# Patient Record
Sex: Male | Born: 1965 | Race: White | Hispanic: No | Marital: Married | Smoking: Never smoker
Health system: Southern US, Community
[De-identification: ages and names within clinical notes are randomized; demographics above are authoritative.]

## PROBLEM LIST (undated history)

## (undated) DIAGNOSIS — I10 Essential (primary) hypertension: Secondary | ICD-10-CM

## (undated) HISTORY — PX: FRACTURE SURGERY: SHX138

---

## 2002-06-21 ENCOUNTER — Encounter: Payer: Self-pay | Admitting: Internal Medicine

## 2002-06-21 ENCOUNTER — Ambulatory Visit (HOSPITAL_COMMUNITY): Admission: RE | Admit: 2002-06-21 | Discharge: 2002-06-21 | Payer: Self-pay | Admitting: Internal Medicine

## 2002-07-15 ENCOUNTER — Encounter: Admission: RE | Admit: 2002-07-15 | Discharge: 2002-10-13 | Payer: Self-pay | Admitting: Internal Medicine

## 2006-01-23 ENCOUNTER — Ambulatory Visit: Payer: Self-pay | Admitting: Internal Medicine

## 2006-01-23 LAB — CONVERTED CEMR LAB
ALT: 30 units/L (ref 0–40)
AST: 26 units/L (ref 0–37)
Albumin: 4 g/dL (ref 3.5–5.2)
Alkaline Phosphatase: 51 units/L (ref 39–117)
BUN: 20 mg/dL (ref 6–23)
Basophils Absolute: 0 10*3/uL (ref 0.0–0.1)
Basophils Relative: 0.2 % (ref 0.0–1.0)
CO2: 30 meq/L (ref 19–32)
Calcium: 9.7 mg/dL (ref 8.4–10.5)
Chloride: 104 meq/L (ref 96–112)
Chol/HDL Ratio, serum: 4.8
Cholesterol: 236 mg/dL (ref 0–200)
Creatinine, Ser: 1.3 mg/dL (ref 0.5–1.7)
Eosinophil percent: 2.5 % (ref 0.0–5.0)
GFR calc non Af Amer: 62 mL/min
Glomerular Filtration Rate, Af Am: 75 mL/min/{1.73_m2}
Glucose, Bld: 85 mg/dL (ref 70–99)
HCT: 47.6 % (ref 39.0–52.0)
HDL: 49.6 mg/dL (ref >39.0)
Hemoglobin: 16.2 g/dL (ref 13.0–17.0)
LDL DIRECT: 182 mg/dL
Lymphocytes Relative: 35.3 % (ref 12.0–46.0)
MCHC: 34 g/dL (ref 30.0–36.0)
MCV: 86.9 fL (ref 78.0–100.0)
Monocytes Absolute: 0.6 10*3/uL (ref 0.2–0.7)
Monocytes Relative: 10.8 % (ref 3.0–11.0)
Neutro Abs: 2.9 10*3/uL (ref 1.4–7.7)
Neutrophils Relative %: 51.2 % (ref 43.0–77.0)
PSA: 0.31 ng/mL (ref 0.10–4.00)
Platelets: 220 10*3/uL (ref 150–400)
Potassium: 4.2 meq/L (ref 3.5–5.5)
RBC: 5.48 M/uL (ref 4.22–5.81)
RDW: 13.2 % (ref 11.5–14.6)
Sodium: 143 meq/L (ref 135–145)
TSH: 3.35 microintl units/mL (ref 0.35–5.50)
Total Bilirubin: 1 mg/dL (ref 0.3–1.2)
Total Protein: 6.9 g/dL (ref 6.0–8.3)
Triglyceride fasting, serum: 96 mg/dL (ref 0–149)
VLDL: 19 mg/dL (ref 0–40)
WBC: 5.6 10*3/uL (ref 4.5–10.5)

## 2006-02-10 ENCOUNTER — Ambulatory Visit: Payer: Self-pay | Admitting: Internal Medicine

## 2006-07-15 ENCOUNTER — Ambulatory Visit: Payer: Self-pay | Admitting: Internal Medicine

## 2006-07-15 LAB — CONVERTED CEMR LAB
Chol/HDL Ratio, serum: 4.6
Cholesterol: 192 mg/dL (ref 0–200)
HDL: 42.1 mg/dL (ref >39.0)
LDL Cholesterol: 133 mg/dL — ABNORMAL HIGH (ref 0–99)
Triglyceride fasting, serum: 83 mg/dL (ref 0–149)
VLDL: 17 mg/dL (ref 0–40)

## 2006-07-29 ENCOUNTER — Ambulatory Visit: Payer: Self-pay | Admitting: Internal Medicine

## 2006-09-05 ENCOUNTER — Ambulatory Visit: Payer: Self-pay | Admitting: Internal Medicine

## 2006-10-06 ENCOUNTER — Encounter: Admission: RE | Admit: 2006-10-06 | Discharge: 2007-01-04 | Payer: Self-pay | Admitting: Internal Medicine

## 2009-05-08 ENCOUNTER — Encounter: Payer: Self-pay | Admitting: Internal Medicine

## 2009-06-06 ENCOUNTER — Ambulatory Visit: Payer: Self-pay | Admitting: Internal Medicine

## 2009-06-06 LAB — CONVERTED CEMR LAB
ALT: 34 units/L (ref 0–53)
AST: 23 units/L (ref 0–37)
Albumin: 4.1 g/dL (ref 3.5–5.2)
Alkaline Phosphatase: 58 units/L (ref 39–117)
BUN: 16 mg/dL (ref 6–23)
Basophils Absolute: 0.1 10*3/uL (ref 0.0–0.1)
Basophils Relative: 1 % (ref 0.0–3.0)
Bilirubin Urine: NEGATIVE
Bilirubin, Direct: 0.1 mg/dL (ref 0.0–0.3)
CO2: 29 meq/L (ref 19–32)
Calcium: 9.2 mg/dL (ref 8.4–10.5)
Chloride: 101 meq/L (ref 96–112)
Cholesterol: 244 mg/dL — ABNORMAL HIGH (ref 0–200)
Creatinine, Ser: 1.1 mg/dL (ref 0.4–1.5)
Direct LDL: 175.3 mg/dL
Eosinophils Absolute: 0.1 10*3/uL (ref 0.0–0.7)
Eosinophils Relative: 2.5 % (ref 0.0–5.0)
GFR calc non Af Amer: 77.63 mL/min (ref >60)
Glucose, Bld: 98 mg/dL (ref 70–99)
HCT: 46.7 % (ref 39.0–52.0)
HDL: 50.2 mg/dL (ref >39.00)
Hemoglobin, Urine: NEGATIVE
Hemoglobin: 16.1 g/dL (ref 13.0–17.0)
Ketones, ur: NEGATIVE mg/dL
Leukocytes, UA: NEGATIVE
Lymphocytes Relative: 36.7 % (ref 12.0–46.0)
Lymphs Abs: 1.9 10*3/uL (ref 0.7–4.0)
MCHC: 34.5 g/dL (ref 30.0–36.0)
MCV: 89.2 fL (ref 78.0–100.0)
Monocytes Absolute: 0.6 10*3/uL (ref 0.1–1.0)
Monocytes Relative: 10.7 % (ref 3.0–12.0)
Neutro Abs: 2.5 10*3/uL (ref 1.4–7.7)
Neutrophils Relative %: 49.1 % (ref 43.0–77.0)
Nitrite: NEGATIVE
PSA: 0.42 ng/mL (ref 0.10–4.00)
Platelets: 181 10*3/uL (ref 150.0–400.0)
Potassium: 4.4 meq/L (ref 3.5–5.1)
RBC: 5.24 M/uL (ref 4.22–5.81)
RDW: 12.8 % (ref 11.5–14.6)
Sodium: 139 meq/L (ref 135–145)
Specific Gravity, Urine: 1.015 (ref 1.000–1.030)
TSH: 2.69 microintl units/mL (ref 0.35–5.50)
Total Bilirubin: 1 mg/dL (ref 0.3–1.2)
Total CHOL/HDL Ratio: 5
Total Protein, Urine: NEGATIVE mg/dL
Total Protein: 6.9 g/dL (ref 6.0–8.3)
Triglycerides: 173 mg/dL — ABNORMAL HIGH (ref 0.0–149.0)
Urine Glucose: NEGATIVE mg/dL
Urobilinogen, UA: 0.2 (ref 0.0–1.0)
VLDL: 34.6 mg/dL (ref 0.0–40.0)
WBC: 5.2 10*3/uL (ref 4.5–10.5)
pH: 6.5 (ref 5.0–8.0)

## 2009-06-12 ENCOUNTER — Ambulatory Visit: Payer: Self-pay | Admitting: Internal Medicine

## 2009-06-12 DIAGNOSIS — E785 Hyperlipidemia, unspecified: Secondary | ICD-10-CM | POA: Insufficient documentation

## 2009-06-12 DIAGNOSIS — E559 Vitamin D deficiency, unspecified: Secondary | ICD-10-CM | POA: Insufficient documentation

## 2009-06-12 DIAGNOSIS — I10 Essential (primary) hypertension: Secondary | ICD-10-CM | POA: Insufficient documentation

## 2009-09-19 ENCOUNTER — Ambulatory Visit: Payer: Self-pay | Admitting: Internal Medicine

## 2009-09-19 LAB — CONVERTED CEMR LAB
ALT: 28 units/L (ref 0–53)
AST: 21 units/L (ref 0–37)
Albumin: 4.1 g/dL (ref 3.5–5.2)
Alkaline Phosphatase: 50 units/L (ref 39–117)
Bilirubin, Direct: 0.2 mg/dL (ref 0.0–0.3)
Cholesterol: 201 mg/dL — ABNORMAL HIGH (ref 0–200)
Direct LDL: 138.2 mg/dL
HDL: 48 mg/dL (ref >39.00)
Total Bilirubin: 0.8 mg/dL (ref 0.3–1.2)
Total CHOL/HDL Ratio: 4
Total Protein: 7 g/dL (ref 6.0–8.3)
Triglycerides: 173 mg/dL — ABNORMAL HIGH (ref 0.0–149.0)
VLDL: 34.6 mg/dL (ref 0.0–40.0)

## 2009-09-20 LAB — CONVERTED CEMR LAB: Vit D, 25-Hydroxy: 24 ng/mL — ABNORMAL LOW (ref 30–89)

## 2009-09-26 ENCOUNTER — Ambulatory Visit: Payer: Self-pay | Admitting: Internal Medicine

## 2009-09-26 DIAGNOSIS — Z9189 Other specified personal risk factors, not elsewhere classified: Secondary | ICD-10-CM

## 2010-02-23 ENCOUNTER — Ambulatory Visit: Payer: Self-pay | Admitting: Internal Medicine

## 2010-02-23 LAB — CONVERTED CEMR LAB: Vit D, 25-Hydroxy: 53 ng/mL (ref 30–89)

## 2010-03-13 ENCOUNTER — Telehealth: Payer: Self-pay | Admitting: Internal Medicine

## 2010-09-11 NOTE — Progress Notes (Signed)
Summary: lab results  Phone Note Call from Patient   Caller: Patient Call For: Birdie Sons MD Summary of Call: [phone] lab results. Initial call taken by: Lynann Beaver CMA,  March 13, 2010 8:55 AM  Follow-up for Phone Call        Patient notified of normal Vit D level.  Told to take OTC Vit D 2000u once daily . Follow-up by: Gladis Riffle, RN,  March 13, 2010 9:52 AM    New/Updated Medications: VITAMIN D 2000 UNIT TABS (CHOLECALCIFEROL) once daily

## 2010-09-11 NOTE — Assessment & Plan Note (Signed)
Summary: 3 MONTH ROV/NJR   Vital Signs:  Patient profile:   45 year old male Weight:      242 pounds BMI:     32.94 Temp:     98.2 degrees F Pulse rate:   72 / minute Resp:     12 per minute BP sitting:   130 / 96  (left arm)  Vitals Entered By: Gladis Riffle, RN (September 26, 2009 10:34 AM) CC: 3 month rov, labs done Is Patient Diabetic? No Comments BP 108/69-134/88 at home average 128/85   CC:  3 month rov and labs done.  History of Present Illness:  Follow-Up Visit      This is a 45 year old man who presents for Follow-up visit.  The patient denies chest pain and palpitations.  Since the last visit the patient notes no new problems or concerns.  The patient reports taking meds as prescribed.  When questioned about possible medication side effects, the patient notes none.    All other systems reviewed and were negative   Preventive Screening-Counseling & Management  Alcohol-Tobacco     Smoking Status: never  Medications Prior to Update: 1)  Benadryl 25 Mg Caps (Diphenhydramine Hcl) .... Prn 2)  Ergocalciferol 50000 Unit Caps (Ergocalciferol) .... One By Mouth Weekly  Allergies (verified): No Known Drug Allergies  Past History:  Past Medical History: Last updated: 06/12/2009 Unremarkable Hyperlipidemia  Past Surgical History: Last updated: 04/21/2007 Denies surgical history  Family History: Last updated: 06/12/2009 Family History of Dementia---mother (late 13s), grandmother Family History Diabetes 1st degree relative Family History High cholesterol---father Family History Hypertension---father  Social History: Last updated: 06/12/2009 Occupation: Systems developer Married Never Smoked Alcohol use-yes Drug use-no Regular exercise-no  Risk Factors: Exercise: no (04/21/2007)  Risk Factors: Smoking Status: never (09/26/2009)  Review of Systems       All other systems reviewed and were negative   Physical Exam  General:   Well-developed,well-nourished,in no acute distress; alert,appropriate and cooperative throughout examination Head:  Normocephalic and atraumatic without obvious abnormalities. No apparent alopecia or balding. Neck:  No deformities, masses, or tenderness noted. Chest Wall:  No deformities, masses, tenderness or gynecomastia noted. Lungs:  Normal respiratory effort, chest expands symmetrically. Lungs are clear to auscultation, no crackles or wheezes. Abdomen:  Bowel sounds positive,abdomen soft and non-tender without masses, organomegaly or hernias noted. Msk:  No deformity or scoliosis noted of thoracic or lumbar spine.   Pulses:  R radial normal and L radial normal.   Neurologic:  cranial nerves II-XII intact and gait normal.     Impression & Recommendations:  Problem # 1:  HYPERLIPIDEMIA (ICD-272.4) much improved he says he has lost some weight Labs Reviewed: SGOT: 21 (09/19/2009)   SGPT: 28 (09/19/2009)   HDL:48.00 (09/19/2009), 50.20 (06/06/2009)  LDL:133 (07/15/2006), DEL (01/23/2006)  Chol:201 (09/19/2009), 244 (06/06/2009)  Trig:173.0 (09/19/2009), 173.0 (06/06/2009)  Problem # 2:  UNSPECIFIED ESSENTIAL HYPERTENSION (ICD-401.9) no sxs---BP better  Complete Medication List: 1)  Benadryl 25 Mg Caps (Diphenhydramine hcl) .... Prn 2)  Ergocalciferol 50000 Unit Caps (Ergocalciferol) .... One by mouth weekly  Patient Instructions: 1)  4months 2)  vit d level-733.00 Prescriptions: ERGOCALCIFEROL 50000 UNIT CAPS (ERGOCALCIFEROL) one by mouth weekly  #1 bottle x 3   Entered and Authorized by:   Birdie Sons MD   Signed by:   Birdie Sons MD on 09/26/2009   Method used:   Reprint   RxID:   [id number] ERGOCALCIFEROL 50000 UNIT CAPS (ERGOCALCIFEROL) one by mouth weekly  #  1 bottle x 3   Entered and Authorized by:   Birdie Sons MD   Signed by:   Birdie Sons MD on 09/26/2009   Method used:   Electronically to        [id number] (retail)       [address]        [place], [place]  [address]       Ph: [phone] or [phone]       Fax: [phone]   RxID:   [id number]   Prevention & Chronic Care Immunizations   Influenza vaccine: Not documented    Tetanus booster: 08/12/2005: Historical    Pneumococcal vaccine: Not documented  Other Screening   Smoking status: never  (09/26/2009)  Lipids   Total Cholesterol: 201  (09/19/2009)   LDL: 133  (07/15/2006)   LDL Direct: 138.2  (09/19/2009)   HDL: 48.00  (09/19/2009)   Triglycerides: 173.0  (09/19/2009)    SGOT (AST): 21  (09/19/2009)   SGPT (ALT): 28  (09/19/2009)   Alkaline phosphatase: 50  (09/19/2009)   Total bilirubin: 0.8  (09/19/2009)  Hypertension   Last Blood Pressure: 130 / 96  (09/26/2009)   Serum creatinine: 1.1  (06/06/2009)   Serum potassium 4.4  (06/06/2009)  Self-Management Support :    Hypertension self-management support: Not documented    Lipid self-management support: Not documented

## 2013-03-26 ENCOUNTER — Ambulatory Visit: Payer: BC Managed Care – PPO | Admitting: Physician Assistant

## 2013-03-26 DIAGNOSIS — T6391XA Toxic effect of contact with unspecified venomous animal, accidental (unintentional), initial encounter: Secondary | ICD-10-CM

## 2013-03-26 LAB — POCT CBC
Granulocyte percent: 69.6 %G (ref 37–80)
HCT, POC: 48.3 % (ref 43.5–53.7)
MCV: 92.1 fL (ref 80–97)
MID (cbc): 0.6 (ref 0–0.9)
Platelet Count, POC: 210 10*3/uL (ref 142–424)
RBC: 5.24 M/uL (ref 4.69–6.13)

## 2013-03-26 MED ORDER — PREDNISONE 20 MG PO TABS: ORAL_TABLET | ORAL | Status: DC

## 2013-03-26 MED ORDER — SULFAMETHOXAZOLE-TMP DS 800-160 MG PO TABS: 1.0000 | ORAL_TABLET | Freq: Two times a day (BID) | ORAL | Status: DC

## 2013-03-26 NOTE — Progress Notes (Signed)
Patient ID: Jakyrie Gillooly MRN: [id number], DOB: 24-Feb-1966, 47 y.o. Date of Encounter: 03/26/2013, 2:21 PM  Primary Physician: Judie Petit, MD  Chief Complaint: Bee sting  HPI: 47 y.o. male presents with a yellow jacket/wasp sting along the right arm. Sting occurred 2 days ago. Notes increasing mild erythema along the right arm that is extending to the mid forearm and up to the axilla. He was also stung/bitten along the right lateral chest wall, however this area is resolving ok. Afebrile. No pain over the elbow. No SOB, wheezing, dyspnea, difficulty swallowing, or angioedema symptoms. He was recently stung by a yellow jacket/wasp a couple of months ago on his leg. This area became an urticarial lesion for a couple of days and self resolved. Has taken Benadryl.   No past medical history on file.   Home Meds: Prior to Admission medications   Not on File    Allergies: No Known Allergies  History   Social History  . Marital Status: Married    Spouse Name: N/A    Number of Children: N/A  . Years of Education: N/A   Occupational History  . Not on file.   Social History Main Topics  . Smoking status: Never Smoker   . Smokeless tobacco: Not on file  . Alcohol Use: Not on file  . Drug Use: Not on file  . Sexual Activity: Not on file   Other Topics Concern  . Not on file   Social History Narrative  . No narrative on file     Review of Systems: Constitutional: negative for chills, fever, or fatigue  Respiratory: negative for wheezing, shortness of breath, or cough Dermatological: see above   Physical Exam: Blood pressure 118/72, pulse 102, temperature 98 F (36.7 C), temperature source Oral, resp. rate 17, height 6\' 1"  (1.854 m), weight 233 lb (105.688 kg), SpO2 97.00%., Body mass index is 30.75 kg/(m^2). General: Well developed, well nourished, in no acute distress. Head: Normocephalic, atraumatic, eyes without discharge, sclera non-icteric, nares are  without discharge.   Neck: Supple. Full ROM.  Lungs: Breathing is unlabored. Heart: Regular rate. Msk:  Strength and tone normal for age. Extremities/Skin: Warm and dry. No clubbing or cyanosis. No edema. Right arm with mild erythema the medial aspect of the elbow that extends distally to the mid forearm and proximally to just below the axilla. Erythema itself resembles allergic reaction, not cellulitis at this time. Mild STS along the forearm and olecranon. Non TTP. FROM. Cap refill less than 2 seconds of the distal extremities.  Neuro: Alert and oriented X 3. Moves all extremities spontaneously. Gait is normal. CNII-XII grossly in tact. Psych:  Responds to questions appropriately with a normal affect.   Labs: Results for orders placed in visit on 03/26/13  POCT CBC      Result Value Range   WBC 7.3  4.6 - 10.2 K/uL   Lymph, poc 1.6  0.6 - 3.4   POC LYMPH PERCENT 21.9  10 - 50 %L   MID (cbc) 0.6  0 - 0.9   POC MID % 8.5  0 - 12 %M   POC Granulocyte 5.1  2 - 6.9   Granulocyte percent 69.6  37 - 80 %G   RBC 5.24  4.69 - 6.13 M/uL   Hemoglobin 16.2  14.1 - 18.1 g/dL   HCT, POC 14.7  82.9 - 53.7 %   MCV 92.1  80 - 97 fL   MCH, POC 30.9  27 - 31.2 pg  MCHC 33.5  31.8 - 35.4 g/dL   RDW, POC 78.4     Platelet Count, POC 210  142 - 424 K/uL   MPV 10.4  0 - 99.8 fL     ASSESSMENT AND PLAN:  47 y.o. male with bee sting/allergic reaction. -Bactrim DS 1 po bid #20 no RF, cover for possible early cellulitis. No current cellulitis at this time.  -Prednisone 20 mg #12 3x2, 2x2, 1x2 no RF -At this time this does not have the appearance of a cellulitic lesion; however given its location I feel it is appropriate to go ahead and treat him with a course of antibiotics to prevent secondary infection.  -Declines Epi Pen -Given at home precautions for future bites/stings -RTC prn  Signed, Eula Listen, PA-C 03/26/2013 2:21 PM

## 2016-04-03 DIAGNOSIS — I10 Essential (primary) hypertension: Secondary | ICD-10-CM | POA: Diagnosis not present

## 2016-04-03 DIAGNOSIS — R600 Localized edema: Secondary | ICD-10-CM | POA: Diagnosis not present

## 2016-04-03 DIAGNOSIS — E785 Hyperlipidemia, unspecified: Secondary | ICD-10-CM | POA: Diagnosis not present

## 2016-04-09 DIAGNOSIS — H40053 Ocular hypertension, bilateral: Secondary | ICD-10-CM | POA: Diagnosis not present

## 2016-07-23 DIAGNOSIS — I10 Essential (primary) hypertension: Secondary | ICD-10-CM | POA: Diagnosis not present

## 2016-09-12 DIAGNOSIS — I10 Essential (primary) hypertension: Secondary | ICD-10-CM | POA: Diagnosis not present

## 2016-09-12 DIAGNOSIS — Z0000 Encounter for general adult medical examination without abnormal findings: Secondary | ICD-10-CM | POA: Diagnosis not present

## 2016-09-12 DIAGNOSIS — E785 Hyperlipidemia, unspecified: Secondary | ICD-10-CM | POA: Diagnosis not present

## 2016-09-12 DIAGNOSIS — E559 Vitamin D deficiency, unspecified: Secondary | ICD-10-CM | POA: Diagnosis not present

## 2016-09-12 DIAGNOSIS — Z125 Encounter for screening for malignant neoplasm of prostate: Secondary | ICD-10-CM | POA: Diagnosis not present

## 2016-09-12 DIAGNOSIS — Z23 Encounter for immunization: Secondary | ICD-10-CM | POA: Diagnosis not present

## 2017-06-12 DIAGNOSIS — M25562 Pain in left knee: Secondary | ICD-10-CM | POA: Diagnosis not present

## 2017-06-12 DIAGNOSIS — Z23 Encounter for immunization: Secondary | ICD-10-CM | POA: Diagnosis not present

## 2017-06-23 DIAGNOSIS — M25561 Pain in right knee: Secondary | ICD-10-CM | POA: Diagnosis not present

## 2017-09-16 DIAGNOSIS — Z125 Encounter for screening for malignant neoplasm of prostate: Secondary | ICD-10-CM | POA: Diagnosis not present

## 2017-09-16 DIAGNOSIS — E785 Hyperlipidemia, unspecified: Secondary | ICD-10-CM | POA: Diagnosis not present

## 2017-09-16 DIAGNOSIS — E559 Vitamin D deficiency, unspecified: Secondary | ICD-10-CM | POA: Diagnosis not present

## 2017-09-16 DIAGNOSIS — R7309 Other abnormal glucose: Secondary | ICD-10-CM | POA: Diagnosis not present

## 2017-09-19 DIAGNOSIS — Z0000 Encounter for general adult medical examination without abnormal findings: Secondary | ICD-10-CM | POA: Diagnosis not present

## 2018-04-09 DIAGNOSIS — H40053 Ocular hypertension, bilateral: Secondary | ICD-10-CM | POA: Diagnosis not present

## 2018-05-13 DIAGNOSIS — M2550 Pain in unspecified joint: Secondary | ICD-10-CM | POA: Diagnosis not present

## 2018-05-13 DIAGNOSIS — I10 Essential (primary) hypertension: Secondary | ICD-10-CM | POA: Diagnosis not present

## 2018-05-13 DIAGNOSIS — Z23 Encounter for immunization: Secondary | ICD-10-CM | POA: Diagnosis not present

## 2018-06-04 DIAGNOSIS — R7309 Other abnormal glucose: Secondary | ICD-10-CM | POA: Diagnosis not present

## 2018-06-04 DIAGNOSIS — I10 Essential (primary) hypertension: Secondary | ICD-10-CM | POA: Diagnosis not present

## 2018-09-24 DIAGNOSIS — Z0000 Encounter for general adult medical examination without abnormal findings: Secondary | ICD-10-CM | POA: Diagnosis not present

## 2018-09-24 DIAGNOSIS — M109 Gout, unspecified: Secondary | ICD-10-CM | POA: Diagnosis not present

## 2018-09-24 DIAGNOSIS — R7303 Prediabetes: Secondary | ICD-10-CM | POA: Diagnosis not present

## 2018-09-24 DIAGNOSIS — Z125 Encounter for screening for malignant neoplasm of prostate: Secondary | ICD-10-CM | POA: Diagnosis not present

## 2018-09-24 DIAGNOSIS — E785 Hyperlipidemia, unspecified: Secondary | ICD-10-CM | POA: Diagnosis not present

## 2018-09-24 DIAGNOSIS — Z23 Encounter for immunization: Secondary | ICD-10-CM | POA: Diagnosis not present

## 2018-09-24 DIAGNOSIS — E559 Vitamin D deficiency, unspecified: Secondary | ICD-10-CM | POA: Diagnosis not present

## 2019-01-29 DIAGNOSIS — Z23 Encounter for immunization: Secondary | ICD-10-CM | POA: Diagnosis not present

## 2019-02-03 DIAGNOSIS — M7710 Lateral epicondylitis, unspecified elbow: Secondary | ICD-10-CM | POA: Diagnosis not present

## 2020-02-03 DIAGNOSIS — E785 Hyperlipidemia, unspecified: Secondary | ICD-10-CM | POA: Diagnosis not present

## 2020-02-03 DIAGNOSIS — Z125 Encounter for screening for malignant neoplasm of prostate: Secondary | ICD-10-CM | POA: Diagnosis not present

## 2020-02-03 DIAGNOSIS — Z0000 Encounter for general adult medical examination without abnormal findings: Secondary | ICD-10-CM | POA: Diagnosis not present

## 2020-02-03 DIAGNOSIS — M109 Gout, unspecified: Secondary | ICD-10-CM | POA: Diagnosis not present

## 2020-02-03 DIAGNOSIS — R7303 Prediabetes: Secondary | ICD-10-CM | POA: Diagnosis not present

## 2020-02-03 DIAGNOSIS — E559 Vitamin D deficiency, unspecified: Secondary | ICD-10-CM | POA: Diagnosis not present

## 2020-04-26 DIAGNOSIS — Z20822 Contact with and (suspected) exposure to covid-19: Secondary | ICD-10-CM | POA: Diagnosis not present

## 2020-04-26 DIAGNOSIS — Z03818 Encounter for observation for suspected exposure to other biological agents ruled out: Secondary | ICD-10-CM | POA: Diagnosis not present

## 2020-08-01 DIAGNOSIS — Z20822 Contact with and (suspected) exposure to covid-19: Secondary | ICD-10-CM | POA: Diagnosis not present

## 2020-08-15 DIAGNOSIS — U071 COVID-19: Secondary | ICD-10-CM | POA: Diagnosis not present

## 2020-08-15 DIAGNOSIS — Z20822 Contact with and (suspected) exposure to covid-19: Secondary | ICD-10-CM | POA: Diagnosis not present

## 2020-08-21 DIAGNOSIS — Z03818 Encounter for observation for suspected exposure to other biological agents ruled out: Secondary | ICD-10-CM | POA: Diagnosis not present

## 2020-08-21 DIAGNOSIS — Z20822 Contact with and (suspected) exposure to covid-19: Secondary | ICD-10-CM | POA: Diagnosis not present

## 2020-11-13 DIAGNOSIS — R059 Cough, unspecified: Secondary | ICD-10-CM | POA: Diagnosis not present

## 2020-11-13 DIAGNOSIS — J988 Other specified respiratory disorders: Secondary | ICD-10-CM | POA: Diagnosis not present

## 2020-11-14 DIAGNOSIS — H25043 Posterior subcapsular polar age-related cataract, bilateral: Secondary | ICD-10-CM | POA: Diagnosis not present

## 2020-11-14 DIAGNOSIS — H524 Presbyopia: Secondary | ICD-10-CM | POA: Diagnosis not present

## 2020-11-14 DIAGNOSIS — H52203 Unspecified astigmatism, bilateral: Secondary | ICD-10-CM | POA: Diagnosis not present

## 2020-11-14 DIAGNOSIS — H5203 Hypermetropia, bilateral: Secondary | ICD-10-CM | POA: Diagnosis not present

## 2021-01-04 ENCOUNTER — Other Ambulatory Visit (HOSPITAL_COMMUNITY): Payer: Self-pay | Admitting: Family Medicine

## 2021-01-26 ENCOUNTER — Other Ambulatory Visit: Payer: Self-pay | Admitting: Family Medicine

## 2021-01-26 ENCOUNTER — Ambulatory Visit
Admission: RE | Admit: 2021-01-26 | Discharge: 2021-01-26 | Disposition: A | Discharge status: Home / Self Care | Payer: BC Managed Care – PPO | Source: Ambulatory Visit | Attending: Family Medicine | Admitting: Family Medicine

## 2021-01-26 DIAGNOSIS — Z136 Encounter for screening for cardiovascular disorders: Secondary | ICD-10-CM | POA: Diagnosis not present

## 2021-01-31 ENCOUNTER — Other Ambulatory Visit: Payer: Self-pay

## 2021-01-31 ENCOUNTER — Ambulatory Visit (HOSPITAL_BASED_OUTPATIENT_CLINIC_OR_DEPARTMENT_OTHER)
Admission: RE | Admit: 2021-01-31 | Discharge: 2021-01-31 | Disposition: A | Discharge status: Home / Self Care | Payer: Self-pay | Source: Ambulatory Visit | Attending: Family Medicine | Admitting: Family Medicine

## 2021-01-31 DIAGNOSIS — Z136 Encounter for screening for cardiovascular disorders: Secondary | ICD-10-CM | POA: Insufficient documentation

## 2021-02-26 DIAGNOSIS — Z136 Encounter for screening for cardiovascular disorders: Secondary | ICD-10-CM | POA: Diagnosis not present

## 2021-02-26 DIAGNOSIS — E559 Vitamin D deficiency, unspecified: Secondary | ICD-10-CM | POA: Diagnosis not present

## 2021-02-26 DIAGNOSIS — Z125 Encounter for screening for malignant neoplasm of prostate: Secondary | ICD-10-CM | POA: Diagnosis not present

## 2021-02-26 DIAGNOSIS — Z0000 Encounter for general adult medical examination without abnormal findings: Secondary | ICD-10-CM | POA: Diagnosis not present

## 2021-02-26 DIAGNOSIS — M109 Gout, unspecified: Secondary | ICD-10-CM | POA: Diagnosis not present

## 2021-02-26 DIAGNOSIS — R7309 Other abnormal glucose: Secondary | ICD-10-CM | POA: Diagnosis not present

## 2021-02-26 DIAGNOSIS — Z1159 Encounter for screening for other viral diseases: Secondary | ICD-10-CM | POA: Diagnosis not present

## 2021-02-26 DIAGNOSIS — E785 Hyperlipidemia, unspecified: Secondary | ICD-10-CM | POA: Diagnosis not present

## 2021-02-28 DIAGNOSIS — Z0000 Encounter for general adult medical examination without abnormal findings: Secondary | ICD-10-CM | POA: Diagnosis not present

## 2021-02-28 DIAGNOSIS — E559 Vitamin D deficiency, unspecified: Secondary | ICD-10-CM | POA: Diagnosis not present

## 2021-02-28 DIAGNOSIS — I10 Essential (primary) hypertension: Secondary | ICD-10-CM | POA: Diagnosis not present

## 2021-02-28 DIAGNOSIS — M109 Gout, unspecified: Secondary | ICD-10-CM | POA: Diagnosis not present

## 2021-02-28 DIAGNOSIS — E785 Hyperlipidemia, unspecified: Secondary | ICD-10-CM | POA: Diagnosis not present

## 2021-08-22 DIAGNOSIS — L089 Local infection of the skin and subcutaneous tissue, unspecified: Secondary | ICD-10-CM | POA: Diagnosis not present

## 2021-08-22 DIAGNOSIS — L02211 Cutaneous abscess of abdominal wall: Secondary | ICD-10-CM | POA: Diagnosis not present

## 2021-09-13 IMAGING — DX DG CHEST 2V
2 series · 2 of 2 positions shown · non-contrast
Comparison: None.

CLINICAL DATA: Screening for cardiovascular condition

EXAM:
CHEST - 2 VIEW

[dg chest 2 view (1 of 2)]
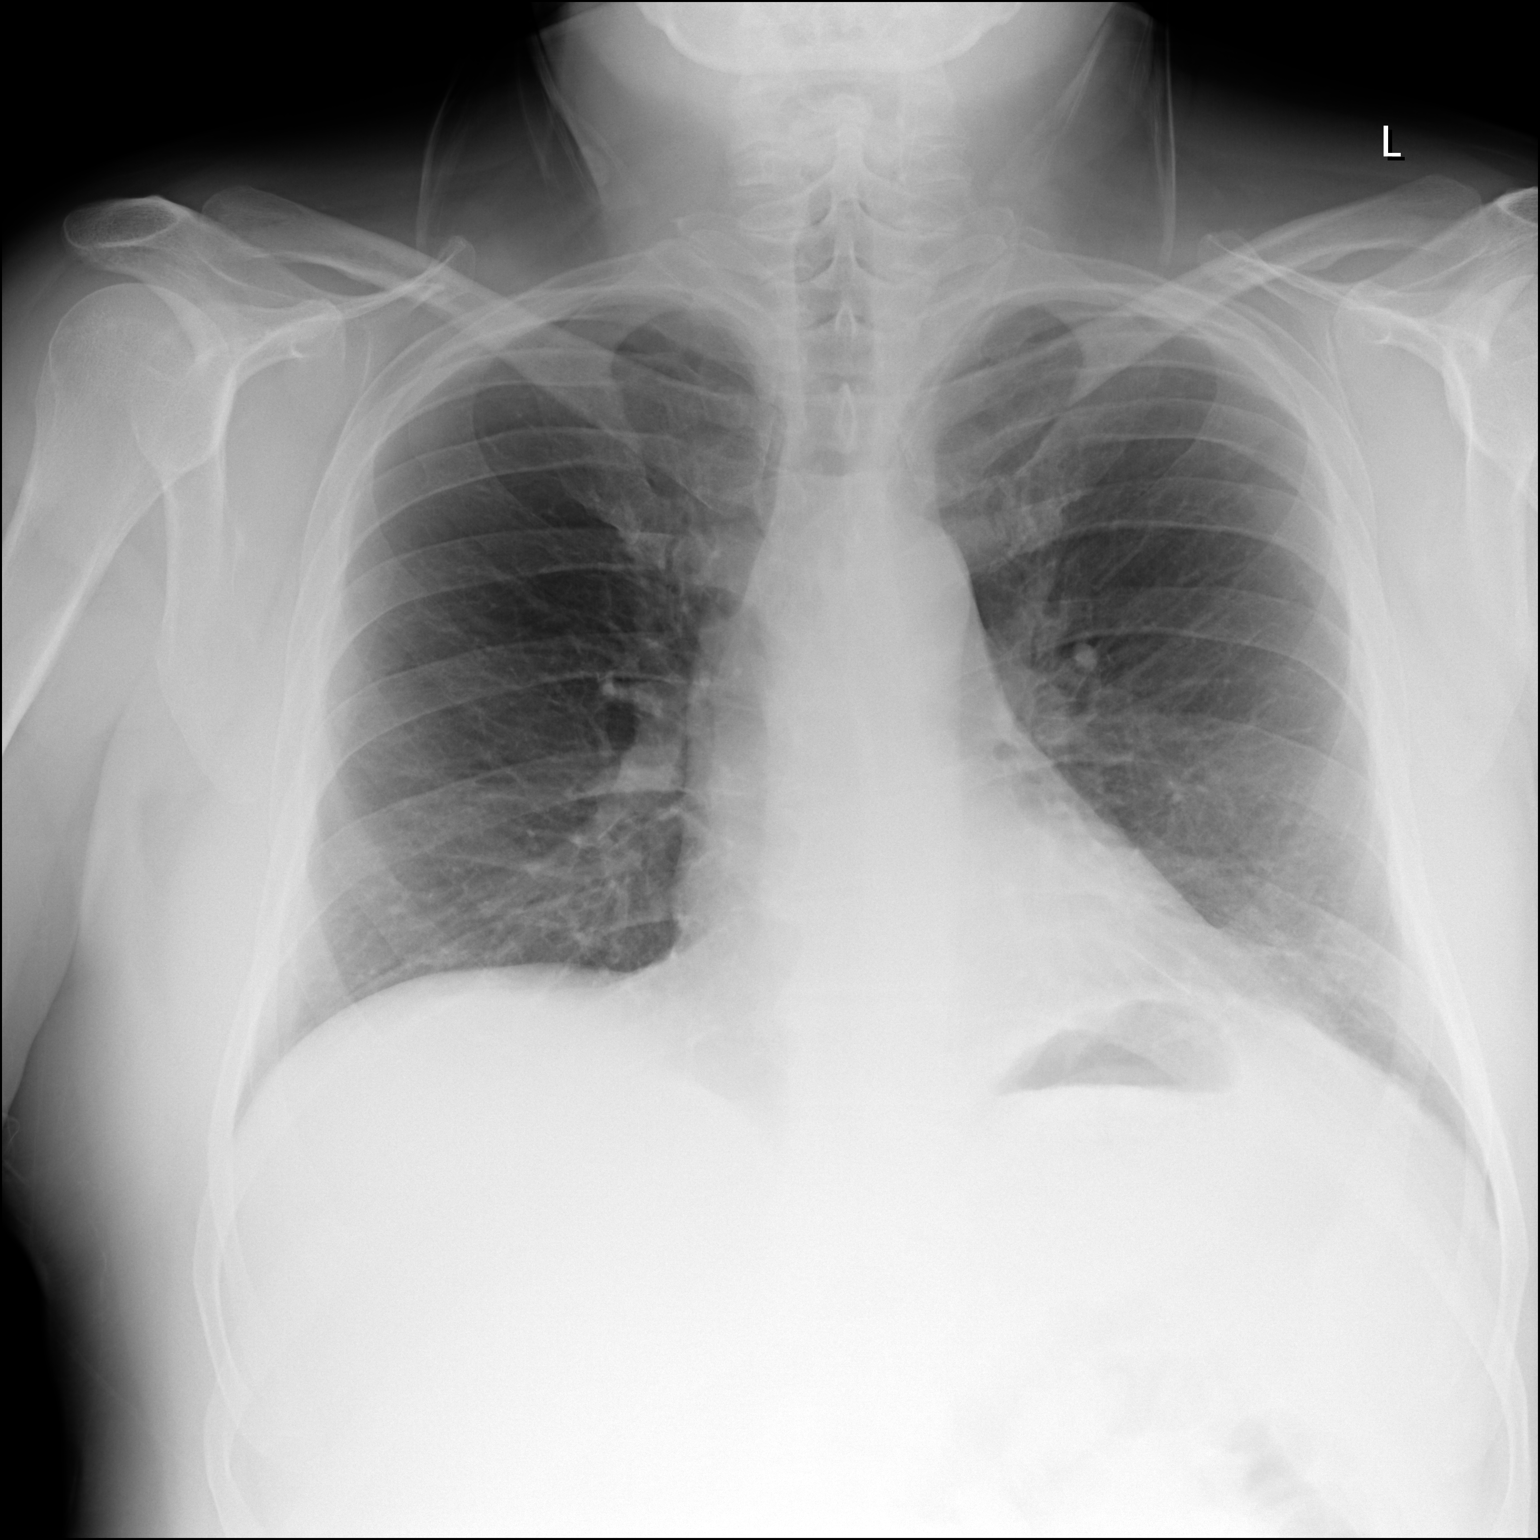

[dg chest 2 view (2 of 2)]
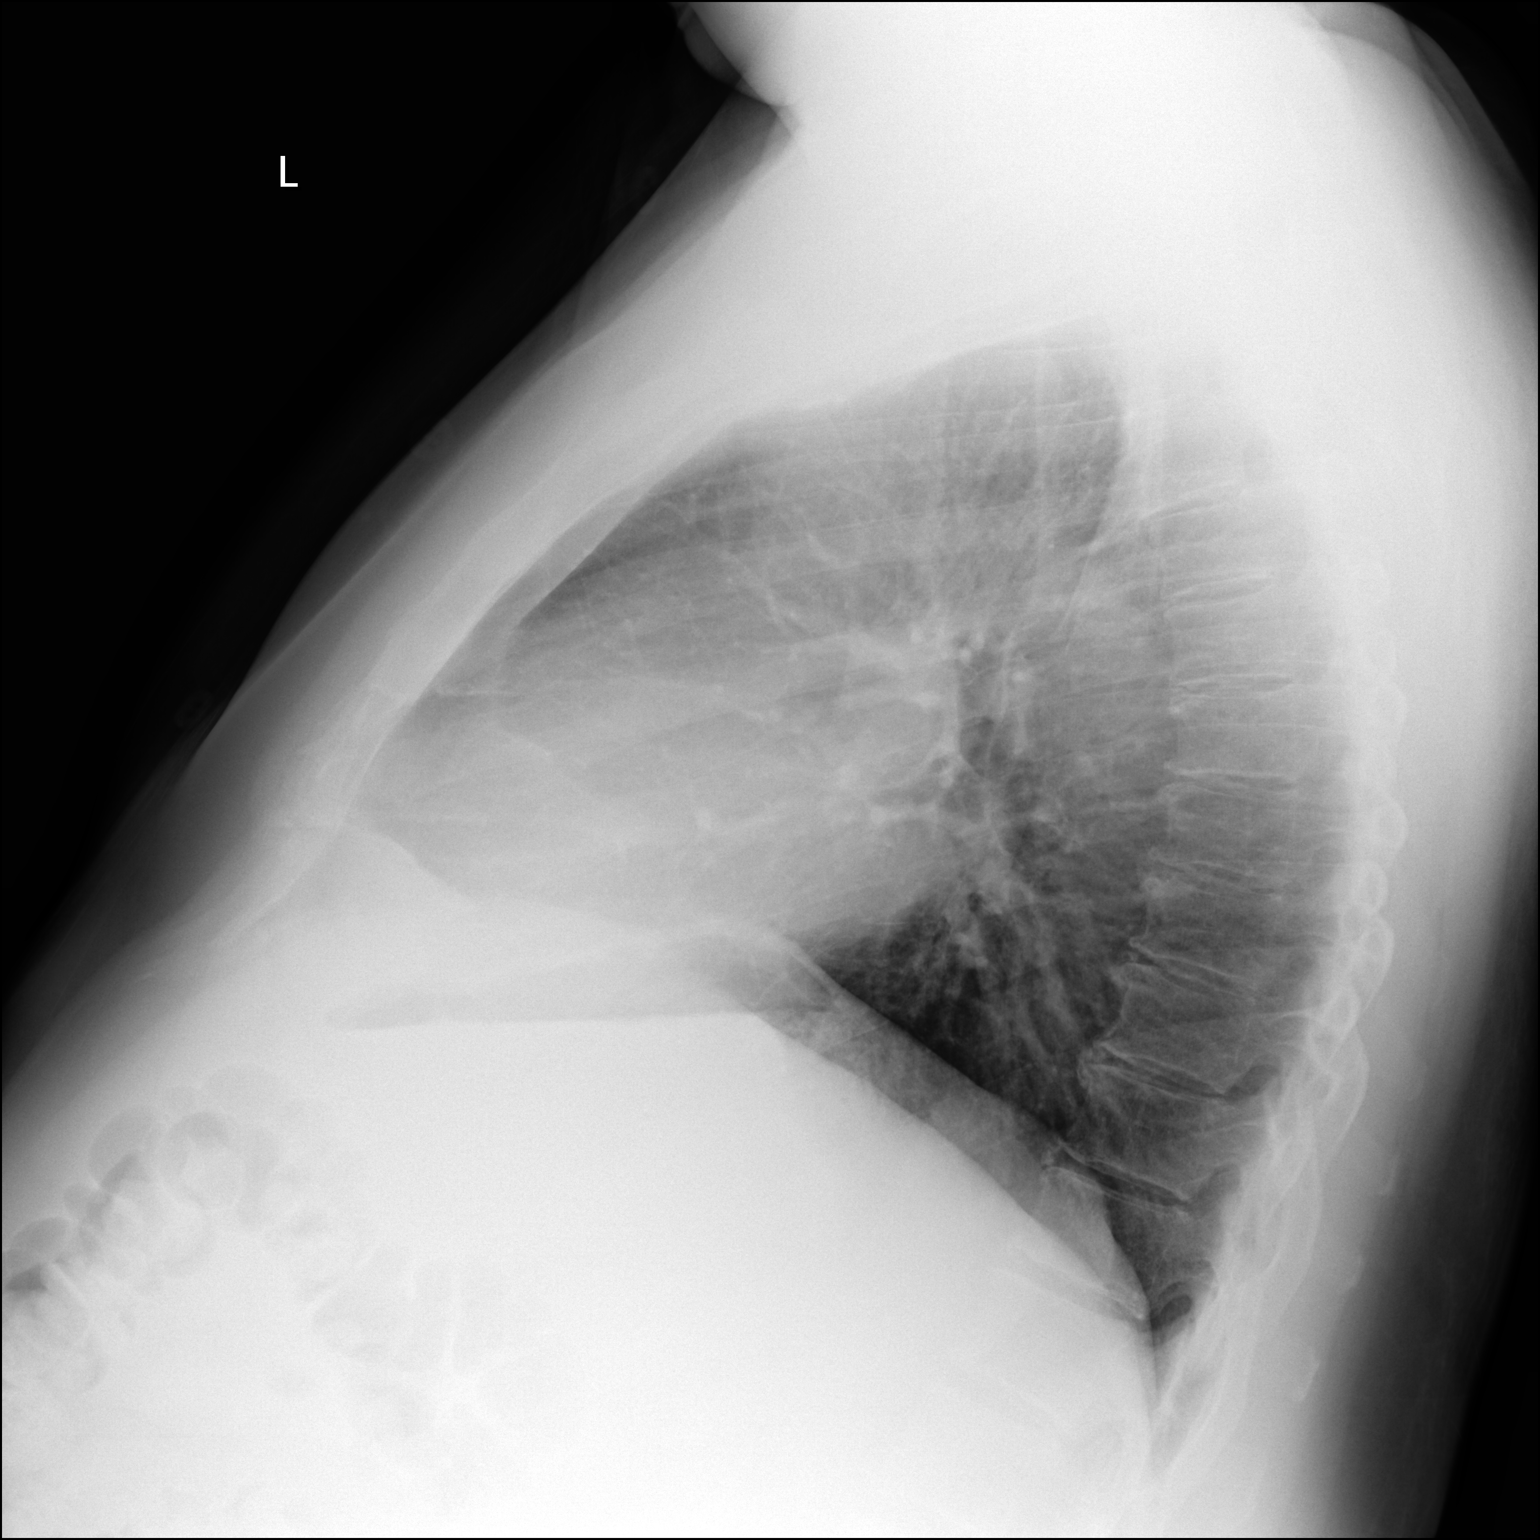

[2 of 2 positions shown; findings below may reference images not displayed]

FINDINGS: The heart size and mediastinal contours are within normal limits.
Both lungs are clear. The visualized skeletal structures are
unremarkable.
IMPRESSION: No active cardiopulmonary disease.

## 2021-09-18 IMAGING — CT CT CARDIAC CORONARY ARTERY CALCIUM SCORE
3 series · 14 of 20 positions shown, 16 images · non-contrast
Comparison: None.
COMPARISON: None.

Addendum:
EXAM:
OVER-READ INTERPRETATION  CT CHEST

The following report is an over-read performed by radiologist Dr.
Bi Lerner [REDACTED] on 01/31/2021. This
over-read does not include interpretation of cardiac or coronary
anatomy or pathology. The coronary calcium score/coronary CTA
interpretation by the cardiologist is attached.
CLINICAL DATA: Cardiovascular Disease Risk stratification
Coronary Calcium Score
TECHNIQUE: A gated, non-contrast computed tomography scan of the heart was
performed using 3mm slice thickness. Axial images were analyzed on a
dedicated workstation. Calcium scoring of the coronary arteries was
performed using the Agatston method.

[Series 2: casc 3.0 best diast 74 % (id) · axial · 0.42mm/px · z∈[+1382,+1454]mm · 4 of 41 slices shown]
[im 9/41  vessel]
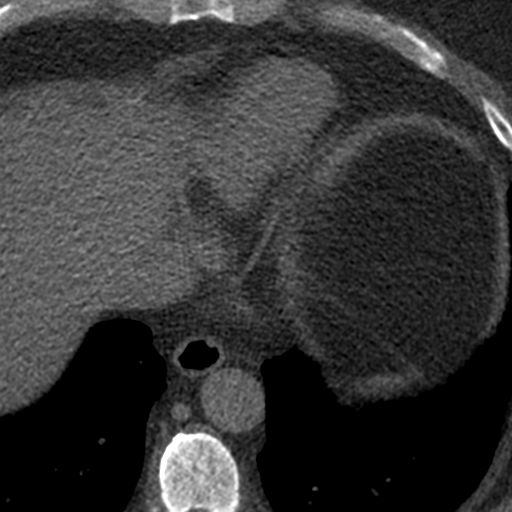
[im 17/41  vessel]
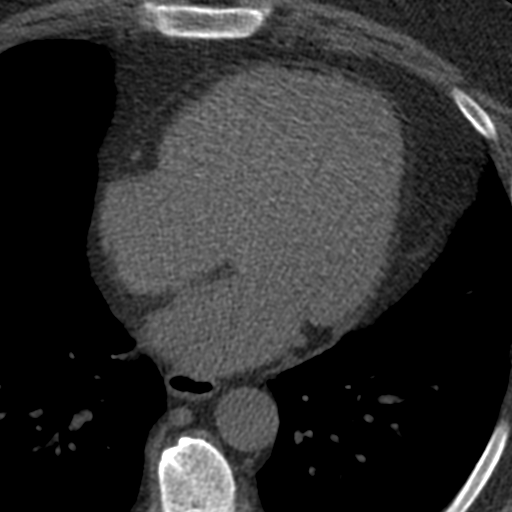
[im 25/41  vessel]
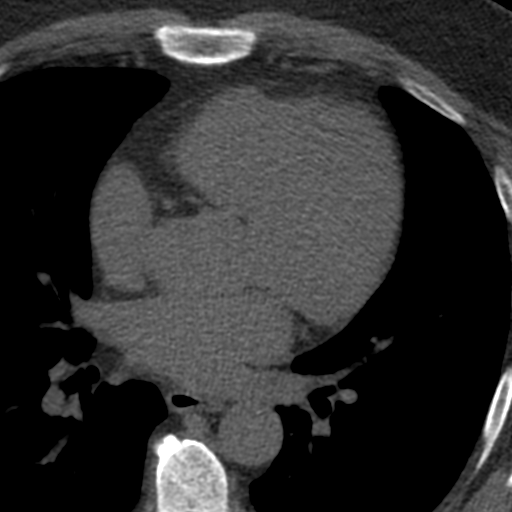
[im 33/41  vessel]
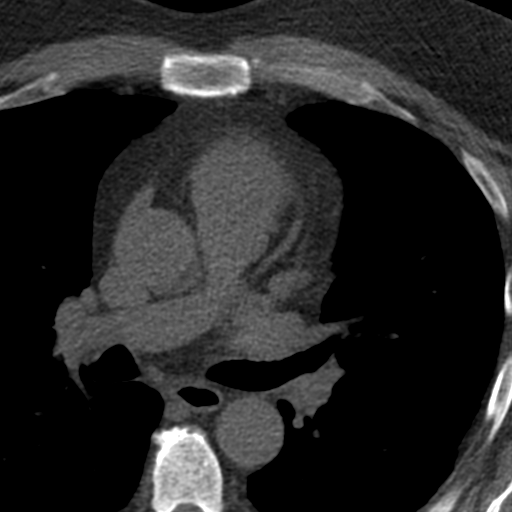

[Series 3: soft full fov 75 % · axial · 0.75mm/px · z∈[+1376,+1456]mm · 5 of 41 slices shown, 7 images]
[im 7/41  vessel]
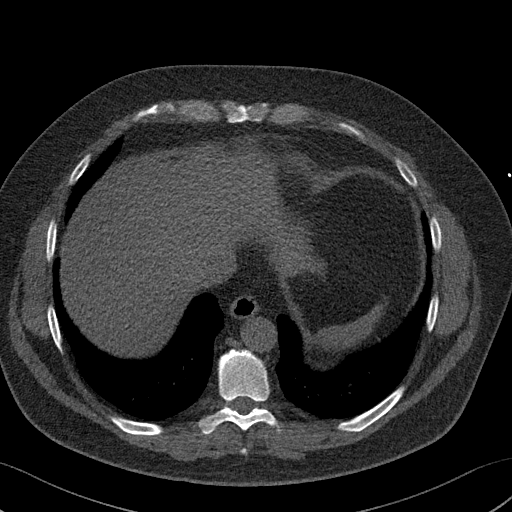
[im 7/41  lung]
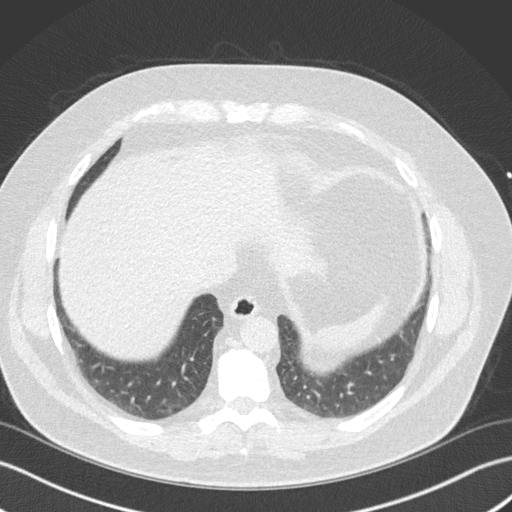
[im 14/41  vessel]
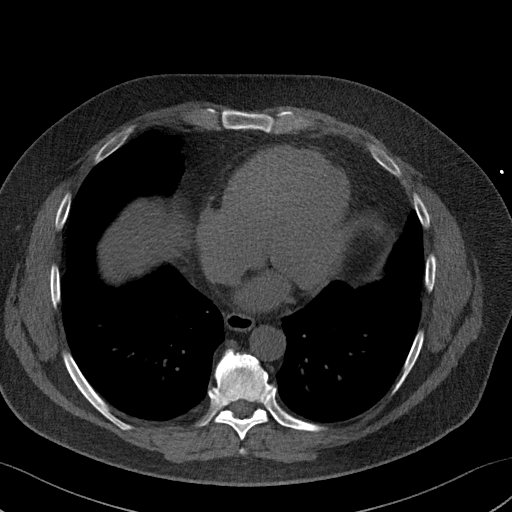
[im 21/41  vessel]
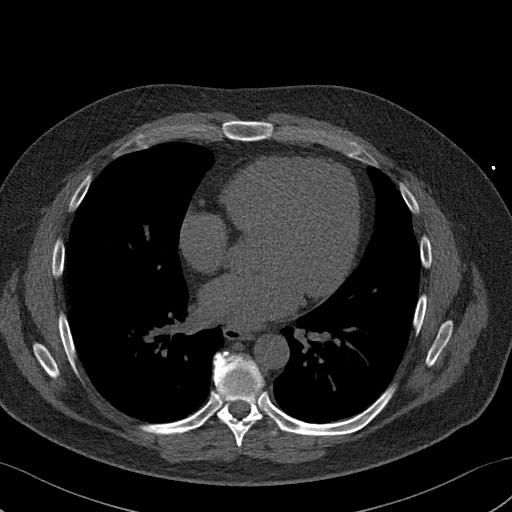
[im 27/41  vessel]
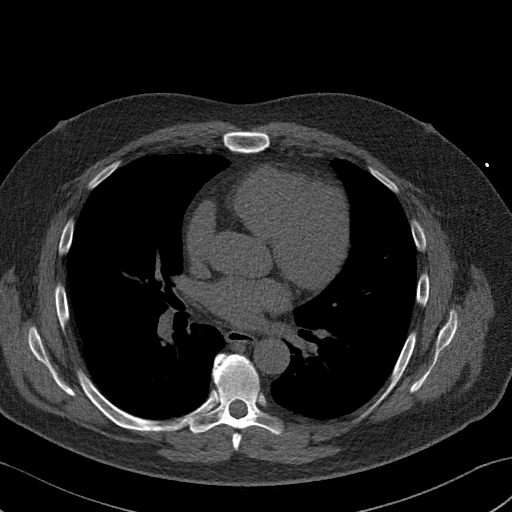
[im 34/41  vessel]
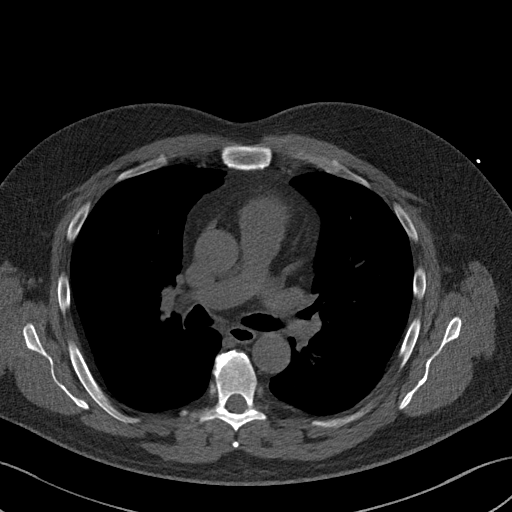
[im 34/41  lung]
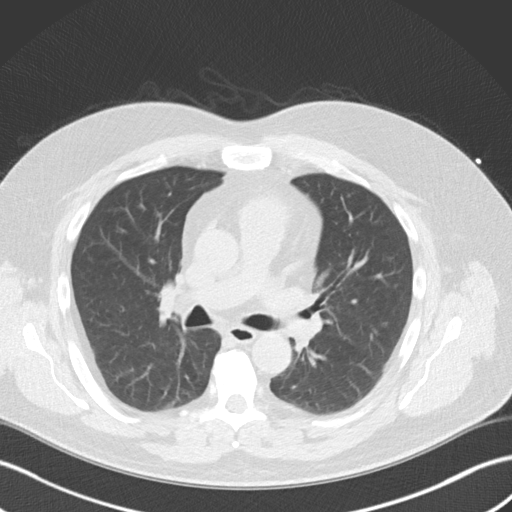

[Series 4: lungs 75 % · axial · 0.75mm/px · z∈[+1376,+1456]mm · 5 of 41 slices shown]
[im 7/41  vessel]
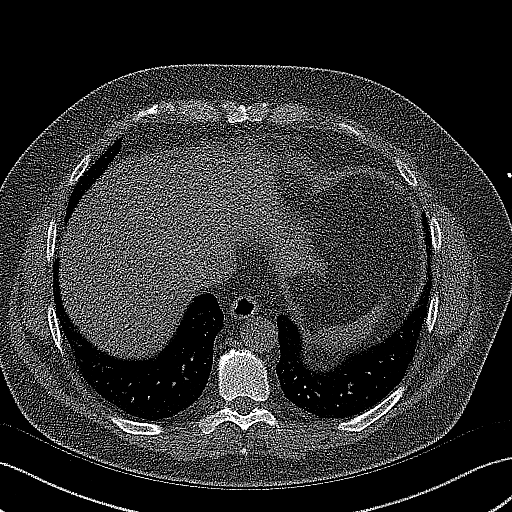
[im 14/41  vessel]
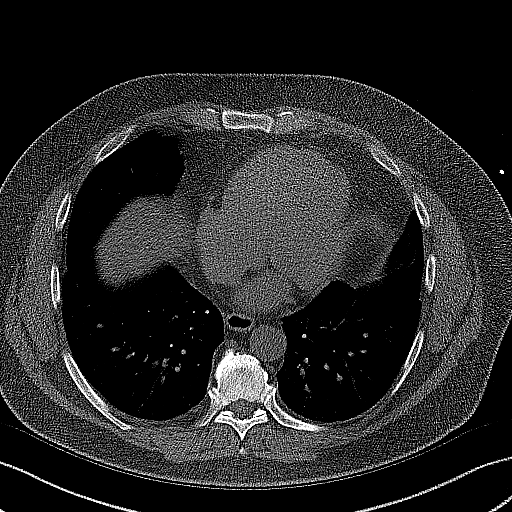
[im 21/41  vessel]
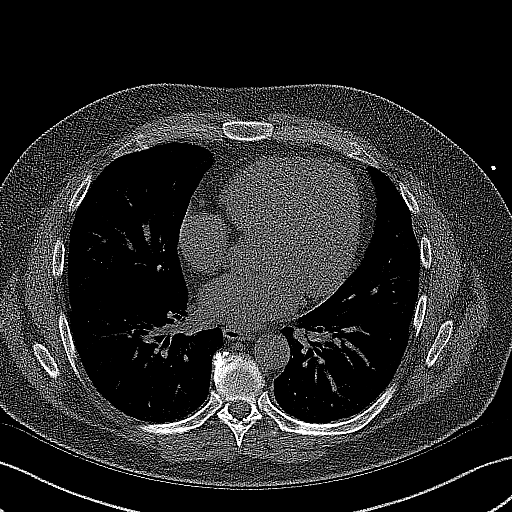
[im 27/41  vessel]
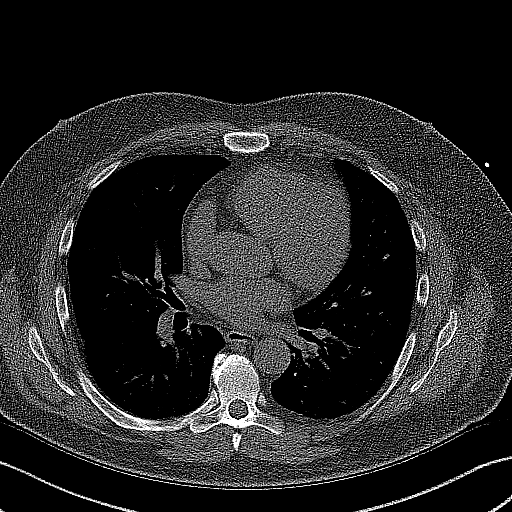
[im 34/41  vessel]
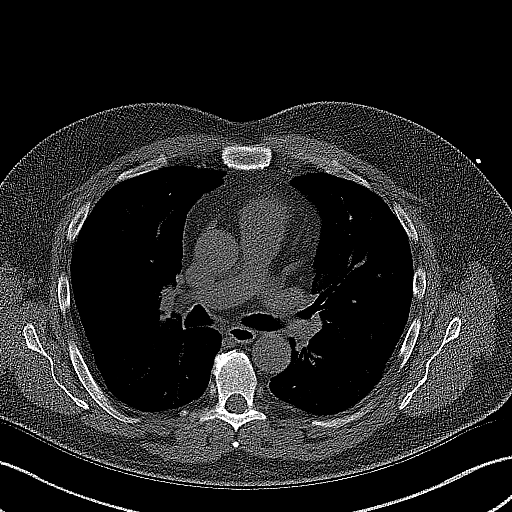

[14 of 20 positions shown; findings below may reference images not displayed]

FINDINGS: Within the visualized portions of the thorax there are no suspicious
appearing pulmonary nodules or masses, there is no acute
consolidative airspace disease, no pleural effusions, no
pneumothorax and no lymphadenopathy. Visualized portions of the
upper abdomen are unremarkable. There are no aggressive appearing
lytic or blastic lesions noted in the visualized portions of the
skeleton.
IMPRESSION: No significant incidental noncardiac findings are noted.
FINDINGS: Coronary arteries: Normal origins.

Coronary Calcium Score:

Left main: 0

Left anterior descending artery: 0

Left circumflex artery: 0

Right coronary artery: 0

Total: 0

Percentile: 0

Pericardium: Normal.

Ascending Aorta: Normal caliber.

Non-cardiac: See separate report from [REDACTED].
IMPRESSION: Coronary calcium score of 0. This is a low risk study.



If CAC=0, it is reasonable to withhold statin therapy and reassess
in 5 to 10 years, as long as higher risk conditions are absent
(diabetes mellitus, family history of premature CHD in first degree
relatives (males <55 years; females <65 years), cigarette smoking,
or LDL >=190 mg/dL).

If CAC is 1 to 99, it is reasonable to initiate statin therapy for
patients >=55 years of age.

If CAC is >=100 or >=75th percentile, it is reasonable to initiate
statin therapy at any age.

Cardiology referral should be considered for patients with CAC
scores >=400 or >=75th percentile.

*7660 AHA/ACC/AACVPR/AAPA/ABC/PINO/INCHES/ABDEDAYEM/Oromiyaa Lalistuu/ATA/DEVINDER/MORAITAKI
Guideline on the Management of Blood Cholesterol: A Report of the
American College of Cardiology/American Heart Association Task Force
on Clinical Practice Guidelines. J Am Coll Cardiol.
0206;73(24):3671-3237.

*** End of Addendum ***
EXAM:
OVER-READ INTERPRETATION  CT CHEST

The following report is an over-read performed by radiologist Dr.
Bi Lerner [REDACTED] on 01/31/2021. This
over-read does not include interpretation of cardiac or coronary
anatomy or pathology. The coronary calcium score/coronary CTA
interpretation by the cardiologist is attached.
FINDINGS: Within the visualized portions of the thorax there are no suspicious
appearing pulmonary nodules or masses, there is no acute
consolidative airspace disease, no pleural effusions, no
pneumothorax and no lymphadenopathy. Visualized portions of the
upper abdomen are unremarkable. There are no aggressive appearing
lytic or blastic lesions noted in the visualized portions of the
skeleton.
IMPRESSION: No significant incidental noncardiac findings are noted.

## 2021-09-24 DIAGNOSIS — R7309 Other abnormal glucose: Secondary | ICD-10-CM | POA: Diagnosis not present

## 2021-10-17 ENCOUNTER — Other Ambulatory Visit: Payer: Self-pay

## 2021-10-17 ENCOUNTER — Ambulatory Visit
Admission: RE | Admit: 2021-10-17 | Discharge: 2021-10-17 | Disposition: A | Discharge status: Home / Self Care | Payer: BC Managed Care – PPO | Source: Ambulatory Visit

## 2021-10-17 VITALS — BP 124/85 | HR 79 | Temp 98.7°F | Resp 18

## 2021-10-17 DIAGNOSIS — J3089 Other allergic rhinitis: Secondary | ICD-10-CM

## 2021-10-17 DIAGNOSIS — J309 Allergic rhinitis, unspecified: Secondary | ICD-10-CM

## 2021-10-17 DIAGNOSIS — J310 Chronic rhinitis: Secondary | ICD-10-CM | POA: Diagnosis not present

## 2021-10-17 DIAGNOSIS — J302 Other seasonal allergic rhinitis: Secondary | ICD-10-CM

## 2021-10-17 DIAGNOSIS — H6983 Other specified disorders of Eustachian tube, bilateral: Secondary | ICD-10-CM

## 2021-10-17 DIAGNOSIS — J329 Chronic sinusitis, unspecified: Secondary | ICD-10-CM

## 2021-10-17 HISTORY — DX: Essential (primary) hypertension: I10

## 2021-10-17 MED ORDER — FAMOTIDINE 40 MG PO TABS: 40.0000 mg | ORAL_TABLET | Freq: Every day | ORAL | 1 refills | Status: AC

## 2021-10-17 MED ORDER — METHYLPREDNISOLONE SODIUM SUCC 125 MG IJ SOLR
125.0000 mg | Freq: Once | INTRAMUSCULAR | Status: AC
  Administered 2021-10-17: 125 mg via INTRAMUSCULAR

## 2021-10-17 MED ORDER — FEXOFENADINE HCL 180 MG PO TABS: 180.0000 mg | ORAL_TABLET | Freq: Every day | ORAL | 1 refills | Status: AC

## 2021-10-17 MED ORDER — IPRATROPIUM BROMIDE 0.06 % NA SOLN: 2.0000 | Freq: Four times a day (QID) | NASAL | 1 refills | Status: AC

## 2021-10-17 MED ORDER — MOMETASONE FUROATE 50 MCG/ACT NA SUSP
2.0000 | Freq: Every day | NASAL | 1 refills | Status: AC
Start: 2021-10-17 — End: 2022-04-15

## 2021-10-17 NOTE — ED Provider Notes (Signed)
[facility]    CSN: [id number] Arrival date & time: 10/17/21  1002    HISTORY   Chief Complaint  Patient presents with   appt 1030   HPI Justin Huang is a 56 y.o. male. Patient complains of sinus congestion, productive cough and sinus pain that started last week.  Patient states he has gone through 2 bottles each of DayQuil and NyQuil without resolution of his symptoms.  Patient denies history of asthma but reports significant history of allergies including multiplications around the world due to his father being in the [employer].  Patient states he had allergy testing and desensitization treatment in the past, states its been more than 30 years ago.  Patient has normal vital signs on arrival today.   Past Medical History:  Diagnosis Date   Hypertension    Patient Active Problem List   Diagnosis Date Noted   RADON EXPOSURE 09/26/2009   UNSPECIFIED VITAMIN D DEFICIENCY 06/12/2009   HYPERLIPIDEMIA 06/12/2009   UNSPECIFIED ESSENTIAL HYPERTENSION 06/12/2009   Past Surgical History:  Procedure Laterality Date   FRACTURE SURGERY      Home Medications    Prior to Admission medications   Medication Sig Start Date End Date Taking? Authorizing Provider  allopurinol (ZYLOPRIM) 100 MG tablet Take 200 mg by mouth daily. 09/21/21   [provider]  atenolol (TENORMIN) 50 MG tablet Take 50 mg by mouth daily. 09/18/21   [provider]  Cholecalciferol (VITAMIN D) 50 MCG (2000 UT) tablet Take 2,000 Units by mouth daily.    [provider]  losartan (COZAAR) 25 MG tablet Take 25 mg by mouth daily. 09/21/21   [provider]   Family History No family history on file. Social History Social History   Tobacco Use   Smoking status: Never   Smokeless tobacco: Never  Substance Use Topics   Alcohol use: Yes    Comment: occasional   Allergies   Patient has no known allergies.  Review of Systems Review of Systems Pertinent findings noted  in history of present illness.   Physical Exam Triage Vital Signs ED Triage Vitals  Enc Vitals Group     BP 06/08/21 0827 (!) 147/82     Pulse Rate 06/08/21 0827 72     Resp 06/08/21 0827 18     Temp 06/08/21 0827 98.3 F (36.8 C)     Temp Source 06/08/21 0827 Oral     SpO2 06/08/21 0827 98 %     Weight --      Height --      Head Circumference --      Peak Flow --      Pain Score 06/08/21 0826 5     Pain Loc --      Pain Edu? --      Excl. in [place]? --   No data found.  Updated Vital Signs BP 124/85 (BP Location: Left Arm)    Pulse 79    Temp 98.7 F (37.1 C) (Oral)    Resp 18    SpO2 96%   Physical Exam Vitals and nursing note reviewed.  Constitutional:      General: He is not in acute distress.    Appearance: Normal appearance. He is not ill-appearing.  HENT:     Head: Normocephalic and atraumatic.     Salivary Glands: Right salivary gland is not diffusely enlarged or tender. Left salivary gland is not diffusely enlarged or tender.     Right Ear: Ear canal  and external ear normal. No drainage. A middle ear effusion is present. There is no impacted cerumen. Tympanic membrane is bulging. Tympanic membrane is not injected or erythematous.     Left Ear: Ear canal and external ear normal. No drainage. A middle ear effusion is present. There is no impacted cerumen. Tympanic membrane is bulging. Tympanic membrane is not injected or erythematous.     Ears:     Comments: Bilateral EACs normal, both TMs bulging with clear fluid    Nose: Rhinorrhea present. No nasal deformity, septal deviation, signs of injury, nasal tenderness, mucosal edema or congestion. Rhinorrhea is clear.     Right Nostril: Occlusion present. No foreign body, epistaxis or septal hematoma.     Left Nostril: Occlusion present. No foreign body, epistaxis or septal hematoma.     Right Turbinates: Enlarged, swollen and pale.     Left Turbinates: Enlarged, swollen and pale.     Right Sinus: No maxillary sinus  tenderness or frontal sinus tenderness.     Left Sinus: No maxillary sinus tenderness or frontal sinus tenderness.     Mouth/Throat:     Lips: Pink. No lesions.     Mouth: Mucous membranes are moist. No oral lesions.     Pharynx: Oropharynx is clear. Uvula midline. No posterior oropharyngeal erythema or uvula swelling.     Tonsils: No tonsillar exudate. 0 on the right. 0 on the left.     Comments: Postnasal drip Eyes:     General: Lids are normal.        Right eye: No discharge.        Left eye: No discharge.     Extraocular Movements: Extraocular movements intact.     Conjunctiva/sclera: Conjunctivae normal.     Right eye: Right conjunctiva is not injected.     Left eye: Left conjunctiva is not injected.  Neck:     Trachea: Trachea and phonation normal.  Cardiovascular:     Rate and Rhythm: Normal rate and regular rhythm.     Pulses: Normal pulses.     Heart sounds: Normal heart sounds. No murmur heard.   No friction rub. No gallop.  Pulmonary:     Effort: Pulmonary effort is normal. No accessory muscle usage, prolonged expiration or respiratory distress.     Breath sounds: Normal breath sounds. No stridor, decreased air movement or transmitted upper airway sounds. No decreased breath sounds, wheezing, rhonchi or rales.  Chest:     Chest wall: No tenderness.  Musculoskeletal:        General: Normal range of motion.     Cervical back: Normal range of motion and neck supple. Normal range of motion.  Lymphadenopathy:     Cervical: No cervical adenopathy.  Skin:    General: Skin is warm and dry.     Findings: No erythema or rash.  Neurological:     General: No focal deficit present.     Mental Status: He is alert and oriented to person, place, and time.  Psychiatric:        Mood and Affect: Mood normal.        Behavior: Behavior normal.    Visual Acuity Right Eye Distance:   Left Eye Distance:   Bilateral Distance:    Right Eye Near:   Left Eye Near:    Bilateral  Near:     UC Couse / Diagnostics / Procedures:    EKG  Radiology No results found.  Procedures Procedures (including critical care time)  UC Diagnoses / Final Clinical Impressions(s)   I have reviewed the triage vital signs and the nursing notes.  Pertinent labs & imaging results that were available during my care of the patient were reviewed by me and considered in my medical decision making (see chart for details).   Final diagnoses:  Perennial allergic rhinitis with seasonal variation  Rhinosinusitis  Allergic sinusitis  Eustachian tube dysfunction, bilateral   Patient provided with methylprednisolone to significantly calm acute allergic inflammation.  Patient provided with H1 and H2 inhibitor, nasal steroid and Atrovent nasal spray.  Return precaution advised.  Allergy testing recommended.  ED Prescriptions     Medication Sig Dispense Auth. Provider   fexofenadine (ALLEGRA) 180 MG tablet Take 1 tablet (180 mg total) by mouth daily. 90 tablet Lynden Oxford Scales, PA-C   mometasone (NASONEX) 50 MCG/ACT nasal spray Place 2 sprays into the nose daily. 3 each Lynden Oxford Scales, PA-C   famotidine (PEPCID) 40 MG tablet Take 1 tablet (40 mg total) by mouth daily. 90 tablet Lynden Oxford Scales, PA-C   ipratropium (ATROVENT) 0.06 % nasal spray Place 2 sprays into both nostrils 4 (four) times daily. 45 mL Lynden Oxford Scales, PA-C      PDMP not reviewed this encounter.  Pending results:  Labs Reviewed - No data to display  Medications Ordered in UC: Medications  methylPREDNISolone sodium succinate (SOLU-MEDROL) 125 mg/2 mL injection 125 mg (125 mg Intramuscular Given 10/17/21 1128)    Disposition Upon Discharge:  Condition: stable for discharge home Home: take medications as prescribed; routine discharge instructions as discussed; follow up as advised.  Patient presented with an acute illness with associated systemic symptoms and significant discomfort requiring  urgent management. In my opinion, this is a condition that a prudent lay person (someone who possesses an average knowledge of health and medicine) may potentially expect to result in complications if not addressed urgently such as respiratory distress, impairment of bodily function or dysfunction of bodily organs.   Routine symptom specific, illness specific and/or disease specific instructions were discussed with the patient and/or caregiver at length.   As such, the patient has been evaluated and assessed, work-up was performed and treatment was provided in alignment with urgent care protocols and evidence based medicine.  Patient/parent/caregiver has been advised that the patient may require follow up for further testing and treatment if the symptoms continue in spite of treatment, as clinically indicated and appropriate.  If the patient was tested for COVID-19, Influenza and/or RSV, then the patient/parent/guardian was advised to isolate at home pending the results of his/her diagnostic coronavirus test and potentially longer if theyre positive. I have also advised pt that if his/her COVID-19 test returns positive, it's recommended to self-isolate for at least 10 days after symptoms first appeared AND until fever-free for 24 hours without fever reducer AND other symptoms have improved or resolved. Discussed self-isolation recommendations as well as instructions for household member/close contacts as per the [facility] and [facility], and also gave patient the [place] packet with this information.  Patient/parent/caregiver has been advised to return to the [facility] or PCP in 3-5 days if no better; to PCP or the Emergency Department if new signs and symptoms develop, or if the current signs or symptoms continue to change or worsen for further workup, evaluation and treatment as clinically indicated and appropriate  The patient will follow up with their current PCP if and as advised. If the patient does not currently  have a PCP we will assist  them in obtaining one.   The patient may need specialty follow up if the symptoms continue, in spite of conservative treatment and management, for further workup, evaluation, consultation and treatment as clinically indicated and appropriate.  Patient/parent/caregiver verbalized understanding and agreement of plan as discussed.  All questions were addressed during visit.  Please see discharge instructions below for further details of plan.  Discharge Instructions:   Discharge Instructions      Your symptoms and my physical exam findings are concerning for exacerbation of your perennial allergies.  It is important that you are consistent with taking allergy medications exactly as prescribed.  I also recommend that you reach out to [facility] [place] to make an appointment with Dr. Edwyna Ready for repeat allergy testing and possible desensitization therapy, their main number is [phone].  Please see the list below for recommended medications, dosages and frequencies to provide relief of your current symptoms:    Methylprednisolone IM (Solu-Medrol):  To quickly address your significant respiratory inflammation, you were provided with an injection of methylprednisolone in the office today.  You should continue to feel the full benefit of this steroid for the next 4 to 6 hours.  This injection will also allow the allergy medications I prescribed for you to become effective in a shorter period of time.   Allegra (fexofenadine): This is an excellent second-generation antihistamine that helps to reduce respiratory inflammatory response to environmental allergens.  This medication is not known to cause daytime sleepiness so it can be taken in the daytime.  If you find that it does make you sleepy, please feel free to take it at bedtime.   Nasonex (mometasone): This is a steroid nasal spray that you use once daily, 2 sprays in each nare.  This medication  does not work well if you decide to use it only used as you feel you need to, it works best used on a daily basis.  After 3 to 5 days of use, you will notice significant reduction of the inflammation and mucus production that is currently being caused by exposure to allergens, whether seasonal or environmental.  The most common side effect of this medication is nosebleeds.  If you experience a nosebleed, please discontinue use for 1 week, then feel free to resume.  I have provided you with a prescription but you can also purchase this medication over-the-counter if your insurance will not cover it.   Ipratropium (Atrovent): This is an excellent nasal decongestant spray that does not cause rebound congestion, please instill 2 sprays into each nare with each use.  Because nasal steroids can take several days before they begin to provide full benefit, I recommend that you use this spray in addition to the nasal steroid prescribed for you.  Please use it after you have used your nasal steroid and repeat up to 4 times daily as needed.  I have provided you with a prescription for this medication.      Ibuprofen  (Advil, Motrin): This is a good anti-inflammatory medication which not only addresses aches, pains but also significantly reduces soft tissue inflammation of the upper airways that causes sinus and nasal congestion as well as inflammation of the lower airways which makes you feel like your breathing is constricted or your cough feel tight.  I recommend that you take between 400 to 600 mg every 6-8 hours as needed for airway inflammation, sinus pain, sinus and ear pressure.      If you find that you  have not had significant relief of your symptoms in the next 7 to 10 days, please follow-up with your primary care provider.   Thank you for visiting urgent care today.  We appreciate the opportunity to participate in your care.     This office note has been dictated using Health visitor.  Unfortunately, and despite my best efforts, this method of dictation can sometimes lead to occasional typographical or grammatical errors.  I apologize in advance if this occurs.     Lynden Oxford Scales, PA-C 10/17/21 1129

## 2021-10-17 NOTE — Discharge Instructions (Addendum)
Your symptoms and my physical exam findings are concerning for exacerbation of your perennial allergies.  It is important that you are consistent with taking allergy medications exactly as prescribed.  I also recommend that you reach out to [facility] [place] to make an appointment with Dr. Edwyna Ready for repeat allergy testing and possible desensitization therapy, their main number is [phone]. ? ?Please see the list below for recommended medications, dosages and frequencies to provide relief of your current symptoms:   ? ?Methylprednisolone IM (Solu-Medrol):  To quickly address your significant respiratory inflammation, you were provided with an injection of methylprednisolone in the office today.  You should continue to feel the full benefit of this steroid for the next 4 to 6 hours.  This injection will also allow the allergy medications I prescribed for you to become effective in a shorter period of time. ?  ?Allegra (fexofenadine): This is an excellent second-generation antihistamine that helps to reduce respiratory inflammatory response to environmental allergens.  This medication is not known to cause daytime sleepiness so it can be taken in the daytime.  If you find that it does make you sleepy, please feel free to take it at bedtime. ?  ?Nasonex (mometasone): This is a steroid nasal spray that you use once daily, 2 sprays in each nare.  This medication does not work well if you decide to use it only used as you feel you need to, it works best used on a daily basis.  After 3 to 5 days of use, you will notice significant reduction of the inflammation and mucus production that is currently being caused by exposure to allergens, whether seasonal or environmental.  The most common side effect of this medication is nosebleeds.  If you experience a nosebleed, please discontinue use for 1 week, then feel free to resume.  I have provided you with a prescription but you can also purchase  this medication over-the-counter if your insurance will not cover it. ?  ?Ipratropium (Atrovent): This is an excellent nasal decongestant spray that does not cause rebound congestion, please instill 2 sprays into each nare with each use.  Because nasal steroids can take several days before they begin to provide full benefit, I recommend that you use this spray in addition to the nasal steroid prescribed for you.  Please use it after you have used your nasal steroid and repeat up to 4 times daily as needed.  I have provided you with a prescription for this medication.    ?  ?Ibuprofen  (Advil, Motrin): This is a good anti-inflammatory medication which not only addresses aches, pains but also significantly reduces soft tissue inflammation of the upper airways that causes sinus and nasal congestion as well as inflammation of the lower airways which makes you feel like your breathing is constricted or your cough feel tight.  I recommend that you take between 400 to 600 mg every 6-8 hours as needed for airway inflammation, sinus pain, sinus and ear pressure.    ?  ?If you find that you have not had significant relief of your symptoms in the next 7 to 10 days, please follow-up with your primary care provider. ?  ?Thank you for visiting urgent care today.  We appreciate the opportunity to participate in your care. ? ?

## 2021-10-17 NOTE — ED Triage Notes (Signed)
C/o sinus congestion, cough that is productive, and sinus pain that started last week. Reports gone through 2 bottles of day and Nyquil.  ?

## 2021-11-12 DIAGNOSIS — D235 Other benign neoplasm of skin of trunk: Secondary | ICD-10-CM | POA: Diagnosis not present

## 2021-11-12 DIAGNOSIS — L821 Other seborrheic keratosis: Secondary | ICD-10-CM | POA: Diagnosis not present

## 2021-11-12 DIAGNOSIS — L281 Prurigo nodularis: Secondary | ICD-10-CM | POA: Diagnosis not present

## 2021-11-12 DIAGNOSIS — L738 Other specified follicular disorders: Secondary | ICD-10-CM | POA: Diagnosis not present

## 2021-11-26 DIAGNOSIS — H5203 Hypermetropia, bilateral: Secondary | ICD-10-CM | POA: Diagnosis not present

## 2021-11-26 DIAGNOSIS — H52203 Unspecified astigmatism, bilateral: Secondary | ICD-10-CM | POA: Diagnosis not present

## 2021-11-26 DIAGNOSIS — H25043 Posterior subcapsular polar age-related cataract, bilateral: Secondary | ICD-10-CM | POA: Diagnosis not present

## 2021-12-18 DIAGNOSIS — E785 Hyperlipidemia, unspecified: Secondary | ICD-10-CM | POA: Diagnosis not present

## 2021-12-18 DIAGNOSIS — I10 Essential (primary) hypertension: Secondary | ICD-10-CM | POA: Diagnosis not present

## 2021-12-18 DIAGNOSIS — M1A9XX0 Chronic gout, unspecified, without tophus (tophi): Secondary | ICD-10-CM | POA: Diagnosis not present

## 2021-12-18 DIAGNOSIS — R739 Hyperglycemia, unspecified: Secondary | ICD-10-CM | POA: Diagnosis not present

## 2022-03-06 DIAGNOSIS — Z131 Encounter for screening for diabetes mellitus: Secondary | ICD-10-CM | POA: Diagnosis not present

## 2022-03-06 DIAGNOSIS — R739 Hyperglycemia, unspecified: Secondary | ICD-10-CM | POA: Diagnosis not present

## 2022-03-06 DIAGNOSIS — Z125 Encounter for screening for malignant neoplasm of prostate: Secondary | ICD-10-CM | POA: Diagnosis not present

## 2022-03-06 DIAGNOSIS — I10 Essential (primary) hypertension: Secondary | ICD-10-CM | POA: Diagnosis not present

## 2022-03-06 DIAGNOSIS — Z136 Encounter for screening for cardiovascular disorders: Secondary | ICD-10-CM | POA: Diagnosis not present

## 2022-03-06 DIAGNOSIS — M1A9XX0 Chronic gout, unspecified, without tophus (tophi): Secondary | ICD-10-CM | POA: Diagnosis not present

## 2022-03-06 DIAGNOSIS — E785 Hyperlipidemia, unspecified: Secondary | ICD-10-CM | POA: Diagnosis not present

## 2022-03-21 DIAGNOSIS — Z1211 Encounter for screening for malignant neoplasm of colon: Secondary | ICD-10-CM | POA: Diagnosis not present

## 2022-03-21 DIAGNOSIS — Z1212 Encounter for screening for malignant neoplasm of rectum: Secondary | ICD-10-CM | POA: Diagnosis not present

## 2022-03-22 DIAGNOSIS — I10 Essential (primary) hypertension: Secondary | ICD-10-CM | POA: Diagnosis not present

## 2022-03-22 DIAGNOSIS — M25571 Pain in right ankle and joints of right foot: Secondary | ICD-10-CM | POA: Diagnosis not present

## 2022-03-22 DIAGNOSIS — G8929 Other chronic pain: Secondary | ICD-10-CM | POA: Diagnosis not present

## 2022-03-22 DIAGNOSIS — R739 Hyperglycemia, unspecified: Secondary | ICD-10-CM | POA: Diagnosis not present

## 2022-03-22 DIAGNOSIS — Z0000 Encounter for general adult medical examination without abnormal findings: Secondary | ICD-10-CM | POA: Diagnosis not present

## 2022-03-22 DIAGNOSIS — E785 Hyperlipidemia, unspecified: Secondary | ICD-10-CM | POA: Diagnosis not present

## 2022-03-22 DIAGNOSIS — M7989 Other specified soft tissue disorders: Secondary | ICD-10-CM | POA: Diagnosis not present

## 2022-03-22 DIAGNOSIS — E8881 Metabolic syndrome: Secondary | ICD-10-CM | POA: Diagnosis not present

## 2022-03-22 DIAGNOSIS — M1A00X0 Idiopathic chronic gout, unspecified site, without tophus (tophi): Secondary | ICD-10-CM | POA: Diagnosis not present

## 2022-03-22 DIAGNOSIS — M109 Gout, unspecified: Secondary | ICD-10-CM | POA: Diagnosis not present
